# Patient Record
Sex: Female | Born: 1995 | Race: Black or African American | Hispanic: No | Marital: Married | Smoking: Never smoker
Health system: Southern US, Community
[De-identification: ages and names within clinical notes are randomized; demographics above are authoritative.]

## PROBLEM LIST (undated history)

## (undated) DIAGNOSIS — F419 Anxiety disorder, unspecified: Secondary | ICD-10-CM

## (undated) DIAGNOSIS — F32A Depression, unspecified: Secondary | ICD-10-CM

---
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
## 2016-11-05 ENCOUNTER — Other Ambulatory Visit: Payer: Self-pay | Admitting: Family

## 2016-11-05 DIAGNOSIS — N644 Mastodynia: Secondary | ICD-10-CM

## 2016-11-14 ENCOUNTER — Other Ambulatory Visit: Payer: Self-pay | Admitting: Family

## 2016-11-14 ENCOUNTER — Ambulatory Visit
Admission: RE | Admit: 2016-11-14 | Discharge: 2016-11-14 | Disposition: A | Discharge status: Home / Self Care | Payer: BLUE CROSS/BLUE SHIELD | Source: Ambulatory Visit | Attending: Family | Admitting: Family

## 2016-11-14 DIAGNOSIS — N644 Mastodynia: Secondary | ICD-10-CM

## 2017-09-21 DIAGNOSIS — Z118 Encounter for screening for other infectious and parasitic diseases: Secondary | ICD-10-CM | POA: Diagnosis not present

## 2017-11-02 DIAGNOSIS — Z3042 Encounter for surveillance of injectable contraceptive: Secondary | ICD-10-CM | POA: Diagnosis not present

## 2017-11-02 DIAGNOSIS — Z3202 Encounter for pregnancy test, result negative: Secondary | ICD-10-CM | POA: Diagnosis not present

## 2018-01-22 DIAGNOSIS — Z3042 Encounter for surveillance of injectable contraceptive: Secondary | ICD-10-CM | POA: Diagnosis not present

## 2018-05-28 DIAGNOSIS — N920 Excessive and frequent menstruation with regular cycle: Secondary | ICD-10-CM | POA: Diagnosis not present

## 2018-07-08 DIAGNOSIS — Z309 Encounter for contraceptive management, unspecified: Secondary | ICD-10-CM | POA: Diagnosis not present

## 2018-07-08 DIAGNOSIS — S39012A Strain of muscle, fascia and tendon of lower back, initial encounter: Secondary | ICD-10-CM | POA: Diagnosis not present

## 2018-07-08 DIAGNOSIS — M79641 Pain in right hand: Secondary | ICD-10-CM | POA: Diagnosis not present

## 2018-07-08 DIAGNOSIS — F419 Anxiety disorder, unspecified: Secondary | ICD-10-CM | POA: Diagnosis not present

## 2019-02-17 DIAGNOSIS — F329 Major depressive disorder, single episode, unspecified: Secondary | ICD-10-CM | POA: Insufficient documentation

## 2022-06-19 ENCOUNTER — Encounter: Payer: Self-pay | Admitting: Nurse Practitioner

## 2022-06-19 NOTE — Progress Notes (Signed)
Not seen

## 2022-07-14 NOTE — L&D Delivery Note (Signed)
Operative Delivery Note At 1:12 PM a viable and healthy female was delivered via Vaginal, Vacuum Investment banker, operational).  Presentation: vertex; Position: OA; Station: +2.  Verbal consent: obtained from patient.  Risks and benefits discussed in detail.  Risks include, but are not limited to the risks of anesthesia, bleeding, infection, damage to maternal tissues, fetal cephalhematoma.  There is also the risk of inability to effect vaginal delivery of the head, or shoulder dystocia that cannot be resolved by established maneuvers, leading to the need for emergency cesarean section. Indication: terminal bradycardia APGAR: 8, 9; weight pending .   Placenta status: spontaneous intact not sent , .   Cord: none with the following complications: .  Cord pH: n/a  Anesthesia:  epidural  Instruments: mushroom vacuum Episiotomy: None Lacerations: None Suture Repair:  n/a Est. Blood Loss (mL): 102  Mom to postpartum.  Baby to Couplet care / Skin to Skin.  Inita Uram A Philander Ake 03/19/2023, 2:27 PM

## 2022-09-04 LAB — OB RESULTS CONSOLE HEPATITIS B SURFACE ANTIGEN: Hepatitis B Surface Ag: NEGATIVE

## 2022-09-04 LAB — OB RESULTS CONSOLE HIV ANTIBODY (ROUTINE TESTING): HIV: NONREACTIVE

## 2022-09-04 LAB — OB RESULTS CONSOLE RUBELLA ANTIBODY, IGM: Rubella: IMMUNE

## 2022-09-04 LAB — OB RESULTS CONSOLE VARICELLA ZOSTER ANTIBODY, IGG: Varicella: UNDETERMINED

## 2022-09-04 LAB — OB RESULTS CONSOLE RPR: RPR: NONREACTIVE

## 2022-09-11 LAB — HEPATITIS C ANTIBODY: HCV Ab: NEGATIVE

## 2023-01-16 ENCOUNTER — Encounter: Payer: Self-pay | Admitting: Family Medicine

## 2023-01-16 ENCOUNTER — Ambulatory Visit (INDEPENDENT_AMBULATORY_CARE_PROVIDER_SITE_OTHER): Payer: Commercial Managed Care - HMO | Admitting: Family Medicine

## 2023-01-16 VITALS — BP 112/68 | HR 95 | Temp 98.5°F | Ht 65.0 in | Wt 190.2 lb

## 2023-01-16 DIAGNOSIS — Z7689 Persons encountering health services in other specified circumstances: Secondary | ICD-10-CM

## 2023-01-16 DIAGNOSIS — F411 Generalized anxiety disorder: Secondary | ICD-10-CM

## 2023-01-16 DIAGNOSIS — Z91018 Allergy to other foods: Secondary | ICD-10-CM | POA: Diagnosis not present

## 2023-01-16 DIAGNOSIS — Z8659 Personal history of other mental and behavioral disorders: Secondary | ICD-10-CM

## 2023-01-16 DIAGNOSIS — Z3A30 30 weeks gestation of pregnancy: Secondary | ICD-10-CM | POA: Diagnosis not present

## 2023-01-16 MED ORDER — EPINEPHRINE 0.3 MG/0.3ML IJ SOAJ: 0.3000 mg | INTRAMUSCULAR | 1 refills | Status: AC | PRN

## 2023-01-16 NOTE — Progress Notes (Signed)
Established Patient Office Visit   Subjective  Patient ID: Natasha Ross, female    DOB: Jun 23, 1996  Age: 27 y.o. MRN: [id number]  Chief Complaint  Patient presents with   Establish Care    Patient is a 27 year old female who presents to establish care and follow-up on ongoing concerns.  Patient states she has not had a PCP in over 4 years.  Previously seen at [facility]    Pregnancy: Patient currently pregnant.  She is a G1P0 at 30 and 2 followed by Dr. Cherly Hensen.  Patient states she has gained 6 pounds during her whole pregnancy.  Taking PNVs.  Denies any current issues.  Endorses history of recurrent UTIs.  Recently treated for UTI as she does not really like drinking water due to the metallic taste.  History of anxiety and depression: Previously on Zoloft 50 mg daily.  Not currently on medication due to pregnancy.  Allergies: NKDA Food allergy strawberries-hives and tongue swelling.  Had Rx for EpiPen.  Social history: Patient is married.  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  Patient will be starting school soon to get her PhD in psychology.  Patient denies alcohol, tobacco, drug use.  Family medical history: Mom-depression, diabetes, MI, HLD, HTN, mental illness, CVA, miscarriage Dad-EtOH abuse, asthma, depression, diabetes, HTN, mental illness MGM-asthma, breast cancer, depression, diabetes, MI, HLD MGF-deceased, arthritis, asthma, prostate cancer, COPD, depression, diabetes, hearing loss, MI, age [place], heart disease, HLD, kidney disease, mental illness, stroke PGM-deceased, breast cancer    History reviewed. No pertinent past medical history. History reviewed. No pertinent surgical history. Social History   Tobacco Use   Smoking status: Never  Substance Use Topics   Alcohol use: No   Family History  Problem Relation Age of Onset   Breast cancer Maternal Grandmother    Allergies  Allergen Reactions   Strawberry Extract      Strawberries (the fruit)-Hives and tongue edema      ROS Negative unless stated above    Objective:     BP 112/68 (BP Location: Left Arm, Patient Position: Sitting, Cuff Size: Normal)   Pulse 95   Temp 98.5 F (36.9 C) (Oral)   Ht 5\' 5"  (1.651 m)   Wt 190 lb 3.2 oz (86.3 kg)   SpO2 99%   BMI 31.65 kg/m    Physical Exam Constitutional:      General: She is not in acute distress.    Appearance: Normal appearance.  HENT:     Head: Normocephalic and atraumatic.     Nose: Nose normal.     Mouth/Throat:     Mouth: Mucous membranes are moist.  Cardiovascular:     Rate and Rhythm: Normal rate and regular rhythm.     Heart sounds: Normal heart sounds. No murmur heard.    No gallop.  Pulmonary:     Effort: Pulmonary effort is normal. No respiratory distress.     Breath sounds: Normal breath sounds. No wheezing, rhonchi or rales.  Abdominal:     Palpations: Abdomen is soft.     Tenderness: There is no abdominal tenderness.     Comments: Gravid.  Skin:    General: Skin is warm and dry.  Neurological:     Mental Status: She is alert and oriented to person, place, and time.        01/16/2023    1:07 PM  GAD 7 : Generalized Anxiety Score  Nervous, Anxious, on Edge 3  Control/stop  worrying 3  Worry too much - different things 3  Trouble relaxing 3  Restless 3  Easily annoyed or irritable 3  Afraid - awful might happen 3  Total GAD 7 Score 21  Anxiety Difficulty Very difficult       01/16/2023    1:07 PM  Depression screen PHQ 2/9  Decreased Interest 1  Down, Depressed, Hopeless 2  PHQ - 2 Score 3  Altered sleeping 3  Tired, decreased energy 3  Change in appetite 3  Feeling bad or failure about yourself  0  Trouble concentrating 2  Moving slowly or fidgety/restless 0  Suicidal thoughts 0  PHQ-9 Score 14  Difficult doing work/chores Very difficult    No results found for any visits on 01/16/23.    Assessment & Plan:  [redacted] weeks gestation of  pregnancy -Patient encouraged to continue PNV's and increase p.o. intake of water -Continue follow-up with Dr. Cherly Hensen, OB/GYN  Food allergy -     EPINEPHrine; Inject 0.3 mg into the muscle as needed for anaphylaxis.  Dispense: 2 each; Refill: 1  GAD (generalized anxiety disorder) -GAD-7 score 21 -Consider counseling -Not currently on Zoloft 50 mg daily due to pregnancy. -Continue to monitor  History of depression -PHQ-9 score 14 -Continue monitor closely  Encounter to establish care -We reviewed the PMH, PSH, FH, SH, Meds and Allergies. -We provided refills for any medications we will prescribe as needed. -We addressed current concerns per orders and patient instructions. -We have asked for records for pertinent exams, studies, vaccines and notes from previous providers. -We have advised patient to follow up per instructions below.   Return if symptoms worsen or fail to improve.   [name], MD

## 2023-01-16 NOTE — Patient Instructions (Signed)
A prescription for an EpiPen sent to her pharmacy just in case she need a new one.

## 2023-02-24 LAB — OB RESULTS CONSOLE GBS: GBS: POSITIVE

## 2023-03-18 ENCOUNTER — Other Ambulatory Visit: Payer: Self-pay

## 2023-03-18 ENCOUNTER — Encounter (HOSPITAL_COMMUNITY): Payer: Self-pay

## 2023-03-18 ENCOUNTER — Inpatient Hospital Stay (HOSPITAL_COMMUNITY)
Admission: AD | Admit: 2023-03-18 | Discharge: 2023-03-21 | DRG: 807 | Disposition: A | Discharge status: Home / Self Care | Payer: Medicaid Other | Attending: Obstetrics and Gynecology | Admitting: Obstetrics and Gynecology

## 2023-03-18 DIAGNOSIS — Z823 Family history of stroke: Secondary | ICD-10-CM | POA: Diagnosis not present

## 2023-03-18 DIAGNOSIS — Z3A39 39 weeks gestation of pregnancy: Secondary | ICD-10-CM | POA: Diagnosis not present

## 2023-03-18 DIAGNOSIS — O134 Gestational [pregnancy-induced] hypertension without significant proteinuria, complicating childbirth: Secondary | ICD-10-CM | POA: Diagnosis present

## 2023-03-18 DIAGNOSIS — O99824 Streptococcus B carrier state complicating childbirth: Secondary | ICD-10-CM | POA: Diagnosis present

## 2023-03-18 DIAGNOSIS — Z833 Family history of diabetes mellitus: Secondary | ICD-10-CM | POA: Diagnosis not present

## 2023-03-18 DIAGNOSIS — Z8249 Family history of ischemic heart disease and other diseases of the circulatory system: Secondary | ICD-10-CM | POA: Diagnosis not present

## 2023-03-18 DIAGNOSIS — O99214 Obesity complicating childbirth: Secondary | ICD-10-CM | POA: Diagnosis present

## 2023-03-18 DIAGNOSIS — O133 Gestational [pregnancy-induced] hypertension without significant proteinuria, third trimester: Principal | ICD-10-CM | POA: Diagnosis present

## 2023-03-18 DIAGNOSIS — O1404 Mild to moderate pre-eclampsia, complicating childbirth: Principal | ICD-10-CM | POA: Diagnosis present

## 2023-03-18 HISTORY — DX: Anxiety disorder, unspecified: F41.9

## 2023-03-18 HISTORY — DX: Depression, unspecified: F32.A

## 2023-03-18 LAB — TYPE AND SCREEN
ABO/RH(D): O POS
Antibody Screen: NEGATIVE

## 2023-03-18 LAB — PROTEIN / CREATININE RATIO, URINE
Creatinine, Urine: 136 mg/dL
Protein Creatinine Ratio: 0.15 mg/mg{creat} (ref 0.00–0.15)
Total Protein, Urine: 20 mg/dL

## 2023-03-18 LAB — COMPREHENSIVE METABOLIC PANEL
ALT: 19 U/L (ref 0–44)
AST: 26 U/L (ref 15–41)
Albumin: 2.8 g/dL — ABNORMAL LOW (ref 3.5–5.0)
Alkaline Phosphatase: 160 U/L — ABNORMAL HIGH (ref 38–126)
Anion gap: 11 (ref 5–15)
BUN: 11 mg/dL (ref 6–20)
CO2: 18 mmol/L — ABNORMAL LOW (ref 22–32)
Calcium: 9 mg/dL (ref 8.9–10.3)
Chloride: 104 mmol/L (ref 98–111)
Creatinine, Ser: 0.8 mg/dL (ref 0.44–1.00)
GFR, Estimated: >60 mL/min (ref >60)
Glucose, Bld: 64 mg/dL — ABNORMAL LOW (ref 70–99)
Potassium: 4 mmol/L (ref 3.5–5.1)
Sodium: 133 mmol/L — ABNORMAL LOW (ref 135–145)
Total Bilirubin: 0.5 mg/dL (ref 0.3–1.2)
Total Protein: 6.8 g/dL (ref 6.5–8.1)

## 2023-03-18 LAB — CBC
HCT: 32 % — ABNORMAL LOW (ref 36.0–46.0)
Hemoglobin: 10.9 g/dL — ABNORMAL LOW (ref 12.0–15.0)
MCH: 31.5 pg (ref 26.0–34.0)
MCHC: 34.1 g/dL (ref 30.0–36.0)
MCV: 92.5 fL (ref 80.0–100.0)
Platelets: 164 10*3/uL (ref 150–400)
RBC: 3.46 MIL/uL — ABNORMAL LOW (ref 3.87–5.11)
RDW: 13.3 % (ref 11.5–15.5)
WBC: 8 10*3/uL (ref 4.0–10.5)
nRBC: 0 % (ref 0.0–0.2)

## 2023-03-18 MED ORDER — TERBUTALINE SULFATE 1 MG/ML IJ SOLN: 0.2500 mg | Freq: Once | INTRAMUSCULAR | Status: DC | PRN

## 2023-03-18 MED ORDER — OXYTOCIN BOLUS FROM INFUSION
333.0000 mL | Freq: Once | INTRAVENOUS | Status: AC
  Administered 2023-03-19: 333 mL via INTRAVENOUS

## 2023-03-18 MED ORDER — LACTATED RINGERS IV SOLN: INTRAVENOUS | Status: DC

## 2023-03-18 MED ORDER — OXYTOCIN-SODIUM CHLORIDE 30-0.9 UT/500ML-% IV SOLN: 2.5000 [IU]/h | INTRAVENOUS | Status: DC

## 2023-03-18 MED ORDER — PENICILLIN G POT IN DEXTROSE 60000 UNIT/ML IV SOLN
3.0000 10*6.[IU] | INTRAVENOUS | Status: DC
  Administered 2023-03-18 – 2023-03-19 (×4): 3 10*6.[IU] via INTRAVENOUS
  Filled 2023-03-18 (×4): qty 50

## 2023-03-18 MED ORDER — LACTATED RINGERS IV SOLN
500.0000 mL | INTRAVENOUS | Status: DC | PRN
  Administered 2023-03-19: 500 mL via INTRAVENOUS

## 2023-03-18 MED ORDER — ACETAMINOPHEN 325 MG PO TABS: 650.0000 mg | ORAL_TABLET | ORAL | Status: DC | PRN

## 2023-03-18 MED ORDER — LIDOCAINE HCL (PF) 1 % IJ SOLN: 30.0000 mL | INTRAMUSCULAR | Status: DC | PRN

## 2023-03-18 MED ORDER — SODIUM CHLORIDE 0.9 % IV SOLN
5.0000 10*6.[IU] | Freq: Once | INTRAVENOUS | Status: AC
  Administered 2023-03-18: 5 10*6.[IU] via INTRAVENOUS
  Filled 2023-03-18: qty 5

## 2023-03-18 MED ORDER — SOD CITRATE-CITRIC ACID 500-334 MG/5ML PO SOLN: 30.0000 mL | ORAL | Status: DC | PRN

## 2023-03-18 MED ORDER — OXYCODONE-ACETAMINOPHEN 5-325 MG PO TABS: 1.0000 | ORAL_TABLET | ORAL | Status: DC | PRN

## 2023-03-18 MED ORDER — OXYCODONE-ACETAMINOPHEN 5-325 MG PO TABS
2.0000 | ORAL_TABLET | ORAL | Status: DC | PRN
  Administered 2023-03-19: 2 via ORAL
  Filled 2023-03-18: qty 2

## 2023-03-18 MED ORDER — MISOPROSTOL 50MCG HALF TABLET
50.0000 ug | ORAL_TABLET | Freq: Once | ORAL | Status: AC
  Administered 2023-03-18: 50 ug via ORAL
  Filled 2023-03-18: qty 1

## 2023-03-18 MED ORDER — MISOPROSTOL 50MCG HALF TABLET
50.0000 ug | ORAL_TABLET | ORAL | Status: DC | PRN
  Administered 2023-03-18: 50 ug via ORAL
  Filled 2023-03-18: qty 1

## 2023-03-18 MED ORDER — MISOPROSTOL 25 MCG QUARTER TABLET
25.0000 ug | ORAL_TABLET | Freq: Once | ORAL | Status: AC
  Administered 2023-03-18: 25 ug via VAGINAL
  Filled 2023-03-18: qty 1

## 2023-03-18 MED ORDER — OXYTOCIN 10 UNIT/ML IJ SOLN: 10.0000 [IU] | Freq: Once | INTRAMUSCULAR | Status: DC

## 2023-03-18 MED ORDER — ONDANSETRON HCL 4 MG/2ML IJ SOLN: 4.0000 mg | Freq: Four times a day (QID) | INTRAMUSCULAR | Status: DC | PRN

## 2023-03-18 NOTE — H&P (Incomplete)
Natasha Ross is an 27 y.o. female. ***  Pertinent Gynecological History: Menses: {menses:16152} Bleeding: {uterine bleeding:32112} Contraception: {contraception:5051} DES exposure: {denies/unknown:32108} Blood transfusions: {none:33079} Sexually transmitted diseases: {std risk:32110} Previous GYN Procedures: {previous procedures:3041388}  Last mammogram: {normal/abnormal***:32111} Date: *** Last pap: {normal/abnormal***:32111} Date: *** OB History: G***, P***   Menstrual History: Menarche age: *** No LMP recorded. Patient is pregnant.    No past medical history on file.  No past surgical history on file.  Family History  Problem Relation Age of Onset   Hypertension Mother    Stroke Mother    Diabetes Mother    Depression Mother    Heart attack Mother    High Cholesterol Mother    Mental illness Mother    Miscarriages / [place] Mother    Mental illness Father    Hypertension Father    Depression Father    Diabetes Father    Asthma Father    Alcohol abuse Father    Depression Maternal Grandmother    High Cholesterol Maternal Grandmother    Cancer Maternal Grandmother        breast cancer   Asthma Maternal Grandmother    Breast cancer Maternal Grandmother    Heart disease Maternal Grandmother    Stroke Maternal Grandfather    Mental illness Maternal Grandfather    Hypertension Maternal Grandfather    High Cholesterol Maternal Grandfather    Heart attack Maternal Grandfather    Diabetes Maternal Grandfather    Cancer Maternal Grandfather        prostate cancer   Asthma Maternal Grandfather    Arthritis Maternal Grandfather    COPD Maternal Grandfather    Kidney disease Maternal Grandfather    Cancer Paternal Grandmother        breast cancer    Social History:  reports that she has never smoked. She does not have any smokeless tobacco history on file. She reports that she does not drink alcohol. No history on file for drug use.  Allergies:  Allergies   Allergen Reactions   Strawberry Extract     Strawberries (the fruit)-Hives and tongue edema    Medications Prior to Admission  Medication Sig Dispense Refill Last Dose   amoxicillin-clavulanate (AUGMENTIN) 875-125 MG per tablet Take 1 tablet by mouth 2 (two) times daily. (Patient not taking: Reported on 01/16/2023) 20 tablet 0    EPINEPHrine 0.3 mg/0.3 mL IJ SOAJ injection Inject 0.3 mg into the muscle as needed for anaphylaxis. 2 each 1    Norethin Ace-Eth Estrad-FE (MINASTRIN 24 FE PO) Take by mouth. (Patient not taking: Reported on 01/16/2023)      triamcinolone cream (KENALOG) 0.1 % Apply 1 application topically 2 (two) times daily. (Patient not taking: Reported on 01/16/2023) 30 g 0     Review of Systems  All other systems reviewed and are negative.   There were no vitals taken for this visit. Physical Exam  No results found for this or any previous visit (from the past 24 hour(s)).  No results found.  Assessment/Plan: ***  Natasha Ross A Natasha Ross 03/18/2023, 5:56 PM

## 2023-03-19 ENCOUNTER — Inpatient Hospital Stay (HOSPITAL_COMMUNITY): Payer: Medicaid Other | Admitting: Anesthesiology

## 2023-03-19 ENCOUNTER — Encounter (HOSPITAL_COMMUNITY): Payer: Self-pay | Admitting: Obstetrics and Gynecology

## 2023-03-19 LAB — CBC
HCT: 32.1 % — ABNORMAL LOW (ref 36.0–46.0)
Hemoglobin: 10.9 g/dL — ABNORMAL LOW (ref 12.0–15.0)
MCH: 31.1 pg (ref 26.0–34.0)
MCHC: 34 g/dL (ref 30.0–36.0)
MCV: 91.5 fL (ref 80.0–100.0)
Platelets: 155 10*3/uL (ref 150–400)
RBC: 3.51 MIL/uL — ABNORMAL LOW (ref 3.87–5.11)
RDW: 13.2 % (ref 11.5–15.5)
WBC: 8.5 10*3/uL (ref 4.0–10.5)
nRBC: 0 % (ref 0.0–0.2)

## 2023-03-19 LAB — RPR: RPR Ser Ql: NONREACTIVE

## 2023-03-19 MED ORDER — WITCH HAZEL-GLYCERIN EX PADS: 1.0000 | MEDICATED_PAD | CUTANEOUS | Status: DC | PRN

## 2023-03-19 MED ORDER — PHENYLEPHRINE 80 MCG/ML (10ML) SYRINGE FOR IV PUSH (FOR BLOOD PRESSURE SUPPORT): 80.0000 ug | PREFILLED_SYRINGE | INTRAVENOUS | Status: DC | PRN

## 2023-03-19 MED ORDER — FERROUS SULFATE 325 (65 FE) MG PO TABS
325.0000 mg | ORAL_TABLET | Freq: Two times a day (BID) | ORAL | Status: DC
  Administered 2023-03-19 – 2023-03-21 (×5): 325 mg via ORAL
  Filled 2023-03-19 (×5): qty 1

## 2023-03-19 MED ORDER — ONDANSETRON HCL 4 MG PO TABS: 4.0000 mg | ORAL_TABLET | ORAL | Status: DC | PRN

## 2023-03-19 MED ORDER — ACETAMINOPHEN 325 MG PO TABS
650.0000 mg | ORAL_TABLET | ORAL | Status: DC | PRN
  Administered 2023-03-20 – 2023-03-21 (×2): 650 mg via ORAL
  Filled 2023-03-19 (×2): qty 2

## 2023-03-19 MED ORDER — IBUPROFEN 600 MG PO TABS
600.0000 mg | ORAL_TABLET | Freq: Four times a day (QID) | ORAL | Status: DC
  Administered 2023-03-19 – 2023-03-21 (×6): 600 mg via ORAL
  Filled 2023-03-19 (×7): qty 1

## 2023-03-19 MED ORDER — DIPHENHYDRAMINE HCL 25 MG PO CAPS: 25.0000 mg | ORAL_CAPSULE | Freq: Four times a day (QID) | ORAL | Status: DC | PRN

## 2023-03-19 MED ORDER — LIDOCAINE HCL (PF) 1 % IJ SOLN
INTRAMUSCULAR | Status: DC | PRN
  Administered 2023-03-19: 2 mL via EPIDURAL
  Administered 2023-03-19: 10 mL via EPIDURAL

## 2023-03-19 MED ORDER — ZOLPIDEM TARTRATE 5 MG PO TABS: 5.0000 mg | ORAL_TABLET | Freq: Every evening | ORAL | Status: DC | PRN

## 2023-03-19 MED ORDER — FENTANYL-BUPIVACAINE-NACL 0.5-0.125-0.9 MG/250ML-% EP SOLN
12.0000 mL/h | EPIDURAL | Status: DC | PRN
  Filled 2023-03-19: qty 250

## 2023-03-19 MED ORDER — COCONUT OIL OIL
1.0000 | TOPICAL_OIL | Status: DC | PRN
  Administered 2023-03-21: 1 via TOPICAL

## 2023-03-19 MED ORDER — TERBUTALINE SULFATE 1 MG/ML IJ SOLN
0.2500 mg | Freq: Once | INTRAMUSCULAR | Status: AC | PRN
  Administered 2023-03-19: 0.25 mg via SUBCUTANEOUS
  Filled 2023-03-19: qty 1

## 2023-03-19 MED ORDER — SIMETHICONE 80 MG PO CHEW: 80.0000 mg | CHEWABLE_TABLET | ORAL | Status: DC | PRN

## 2023-03-19 MED ORDER — ONDANSETRON HCL 4 MG/2ML IJ SOLN: 4.0000 mg | INTRAMUSCULAR | Status: DC | PRN

## 2023-03-19 MED ORDER — FENTANYL-BUPIVACAINE-NACL 0.5-0.125-0.9 MG/250ML-% EP SOLN
EPIDURAL | Status: DC | PRN
  Administered 2023-03-19: 12 mL/h via EPIDURAL

## 2023-03-19 MED ORDER — SENNOSIDES-DOCUSATE SODIUM 8.6-50 MG PO TABS
2.0000 | ORAL_TABLET | Freq: Every day | ORAL | Status: DC
  Administered 2023-03-20 – 2023-03-21 (×2): 2 via ORAL
  Filled 2023-03-19 (×2): qty 2

## 2023-03-19 MED ORDER — FUROSEMIDE 20 MG PO TABS
40.0000 mg | ORAL_TABLET | Freq: Every day | ORAL | Status: DC
  Administered 2023-03-19 – 2023-03-21 (×3): 40 mg via ORAL
  Filled 2023-03-19 (×3): qty 2

## 2023-03-19 MED ORDER — LACTATED RINGERS IV SOLN: 500.0000 mL | Freq: Once | INTRAVENOUS | Status: DC

## 2023-03-19 MED ORDER — PRENATAL MULTIVITAMIN CH
1.0000 | ORAL_TABLET | Freq: Every day | ORAL | Status: DC
  Administered 2023-03-20 – 2023-03-21 (×2): 1 via ORAL
  Filled 2023-03-19 (×2): qty 1

## 2023-03-19 MED ORDER — BENZOCAINE-MENTHOL 20-0.5 % EX AERO: 1.0000 | INHALATION_SPRAY | CUTANEOUS | Status: DC | PRN

## 2023-03-19 MED ORDER — DIBUCAINE (PERIANAL) 1 % EX OINT: 1.0000 | TOPICAL_OINTMENT | CUTANEOUS | Status: DC | PRN

## 2023-03-19 MED ORDER — DIPHENHYDRAMINE HCL 50 MG/ML IJ SOLN: 12.5000 mg | INTRAMUSCULAR | Status: DC | PRN

## 2023-03-19 MED ORDER — LACTATED RINGERS AMNIOINFUSION: INTRAVENOUS | Status: DC

## 2023-03-19 MED ORDER — OXYTOCIN-SODIUM CHLORIDE 30-0.9 UT/500ML-% IV SOLN
1.0000 m[IU]/min | INTRAVENOUS | Status: DC
  Administered 2023-03-19: 2 m[IU]/min via INTRAVENOUS
  Filled 2023-03-19: qty 500

## 2023-03-19 MED ORDER — EPHEDRINE 5 MG/ML INJ: 10.0000 mg | INTRAVENOUS | Status: DC | PRN

## 2023-03-19 NOTE — Progress Notes (Signed)
Natasha Ross is Natasha 27 y.o. G1P0 at [redacted]w[redacted]d by LMP admitted for induction of labor due to Hypertension.  Subjective: C/o back pain S/p cytotec x 2  Objective: BP 134/88   Pulse 74   Temp 98.4 F (36.9 C) (Oral)   Resp 18   Ht 5\' 5"  (1.651 m)   Wt 97.5 kg   SpO2 98%   BMI 35.78 kg/m  No intake/output data recorded. No intake/output data recorded.  FHT:  FHR: 130 bpm, variability: moderate,  accelerations:  Present,  decelerations:  Absent UC:   irregular, every 3-4 minutes SVE:   1 cm dilated, 90 % effaced, -2/-1 station Tracing: cat 1  Labs: Lab Results  Component Value Date   WBC 8.0 03/18/2023   HGB 10.9 (L) 03/18/2023   HCT 32.0 (L) 03/18/2023   MCV 92.5 03/18/2023   PLT 164 03/18/2023      Latest Ref Rng & Units 03/18/2023    6:16 PM  CMP  Glucose 70 - 99 mg/dL 64   BUN 6 - 20 mg/dL 11   Creatinine 5.40 - 1.00 mg/dL 9.81   Sodium 191 - 478 mmol/L 133   Potassium 3.5 - 5.1 mmol/L 4.0   Chloride 98 - 111 mmol/L 104   CO2 22 - 32 mmol/L 18   Calcium 8.9 - 10.3 mg/dL 9.0   Total Protein 6.5 - 8.1 g/dL 6.8   Total Bilirubin 0.3 - 1.2 mg/dL 0.5   Alkaline Phos 38 - 126 U/L 160   AST 15 - 41 U/L 26   ALT 0 - 44 U/L 19    PCR 0.14 Assessment / Plan: Latent phase Gestational HTN Term gestation GBS cx positive P) ptiocin augmentation at 3 am. Continue IV PCN. Circuit for possible OP    Anticipated MOD:  NSVD  Natasha Ross Natasha Ross 03/19/2023, 2:03 AM

## 2023-03-19 NOTE — Progress Notes (Signed)
S: notes some rectal pressure  O:  Epidural  Pitocin 4 miu VS: BP 131/85 P61 VE 9 cm /100/+1 asynclitic  LOP  IUPC,ISE placed  Tracing reviewed. Baseline 130  min accels (+) variable deceleration Ctx q 2-4 mins  ( prolonged ctx) . Suspect tetanic contractions  IMP: active phase Variable decelerations due to cord compression Mild preeclampsia P) start aminoinfusion. D/c pitocin. Positional changes given OP

## 2023-03-19 NOTE — Anesthesia Procedure Notes (Signed)
Epidural Patient location during procedure: OB Start time: 03/19/2023 6:15 AM End time: 03/19/2023 6:25 AM  Staffing Anesthesiologist: Lannie Fields, DO Performed: anesthesiologist   Preanesthetic Checklist Completed: patient identified, IV checked, risks and benefits discussed, monitors and equipment checked, pre-op evaluation and timeout performed  Epidural Patient position: sitting Prep: DuraPrep and site prepped and draped Patient monitoring: continuous pulse ox, blood pressure, heart rate and cardiac monitor Approach: midline Location: L3-L4 Injection technique: LOR air  Needle:  Needle type: Tuohy  Needle gauge: 17 G Needle length: 9 cm Needle insertion depth: 7 cm Catheter type: closed end flexible Catheter size: 19 Gauge Catheter at skin depth: 12 cm Test dose: negative  Assessment Sensory level: T8 Events: blood not aspirated, no cerebrospinal fluid, injection not painful, no injection resistance, no paresthesia and negative IV test  Additional Notes Patient identified. Risks/Benefits/Options discussed with patient including but not limited to bleeding, infection, nerve damage, paralysis, failed block, incomplete pain control, headache, blood pressure changes, nausea, vomiting, reactions to medication both or allergic, itching and postpartum back pain. Confirmed with bedside nurse the patient's most recent platelet count. Confirmed with patient that they are not currently taking any anticoagulation, have any bleeding history or any family history of bleeding disorders. Patient expressed understanding and wished to proceed. All questions were answered. Sterile technique was used throughout the entire procedure. Please see nursing notes for vital signs. Test dose was given through epidural catheter and negative prior to continuing to dose epidural or start infusion. Warning signs of high block given to the patient including shortness of breath, tingling/numbness in  hands, complete motor block, or any concerning symptoms with instructions to call for help. Patient was given instructions on fall risk and not to get out of bed. All questions and concerns addressed with instructions to call with any issues or inadequate analgesia.  Reason for block:procedure for pain

## 2023-03-19 NOTE — H&P (Signed)
Natasha Ross is a 27 y.o. female presenting @ 39 wk for IOL due to gestational HTN noted at office visit today. Pt c/o SOB and increased leg swelling. Denies h/a, visual changes or epigastric pain. OB History     Gravida  2   Para      Term      Preterm      AB   1   Living         SAB   1   IAB      Ectopic      Multiple      Live Births             Past Medical History:  Diagnosis Date   Anxiety    Depression    History reviewed. No pertinent surgical history. Family History: family history includes Alcohol abuse in her father; Arthritis in her maternal grandfather; Asthma in her father, maternal grandfather, and maternal grandmother; Breast cancer in her maternal grandmother; COPD in her maternal grandfather; Cancer in her maternal grandfather, maternal grandmother, and paternal grandmother; Depression in her father, maternal grandmother, and mother; Diabetes in her father, maternal grandfather, and mother; Heart attack in her maternal grandfather and mother; Heart disease in her maternal grandmother; High Cholesterol in her maternal grandfather, maternal grandmother, and mother; Hypertension in her father, maternal grandfather, and mother; Kidney disease in her maternal grandfather; Mental illness in her father, maternal grandfather, and mother; Miscarriages / Stillbirths in her mother; Stroke in her maternal grandfather and mother. Social History:  reports that she has never smoked. She does not have any smokeless tobacco history on file. She reports that she does not currently use alcohol. She reports that she does not use drugs.     Maternal Diabetes: No Genetic Screening: Normal Maternal Ultrasounds/Referrals: Normal Fetal Ultrasounds or other Referrals:  None Maternal Substance Abuse:  No Significant Maternal Medications:  None Significant Maternal Lab Results:  Group B Strep positive Number of Prenatal Visits:greater than 3 verified prenatal visits Other  Comments:  None  Review of Systems  Eyes:  Negative for visual disturbance.  Respiratory:  Positive for shortness of breath.   Cardiovascular:  Positive for leg swelling.  Neurological:  Negative for headaches.   History Dilation: 1 Effacement (%): 70 Station: 0 Exam by:: A.Hairston,RN Blood pressure 134/88, pulse 74, temperature 98.4 F (36.9 C), temperature source Oral, resp. rate 18, height 5\' 5"  (1.651 m), weight 97.5 kg, SpO2 98%. Exam Physical Exam Constitutional:      Appearance: Normal appearance.  Eyes:     Extraocular Movements: Extraocular movements intact.  Cardiovascular:     Rate and Rhythm: Regular rhythm.     Heart sounds: Normal heart sounds.  Pulmonary:     Breath sounds: Normal breath sounds.  Musculoskeletal:        General: Swelling present.     Cervical back: Neck supple.  Skin:    General: Skin is warm and dry.  Neurological:     General: No focal deficit present.     Mental Status: She is alert and oriented to person, place, and time.  Psychiatric:        Mood and Affect: Mood normal.        Behavior: Behavior normal.     Prenatal labs: ABO, Rh: --/--/O POS (09/04 1814) Antibody: NEG (09/04 1814) Rubella: Immune (02/22 0000) RPR: Nonreactive (02/22 0000)  HBsAg: Negative (02/22 0000)  HIV: Non-reactive (02/22 0000)  GBS:   positive  Assessment/Plan: Gestational  HTN Term gestation GBS cx positive P) admit PIH labs. IV PCN. Oral and vaginal cytotec   Emiliya Chretien A Etoile Looman 03/19/2023, 1:32 AM

## 2023-03-19 NOTE — Lactation Note (Addendum)
This note was copied from a baby's chart. Lactation Consultation Note  Patient Name: Natasha Ross [id number] Date: 03/19/2023 Age:26 hours Reason for consult: Initial assessment;Primapara;1st time breastfeeding;Term;Infant < 6lbs  P1- Infant gaggy, but was cleared with patting and bulb syringe. Clear mucus was present.  LC fit MOB with a 24 mm flange. LC noted that MOB's right nipple and areola had dryness. LC asked nurse to provide coconut oil to MOB.  LC reviewed pumping protocol and LC services paper.  LC Plan: Breastfeeding goals for 24 hrs: -Feed with feeding cues -By 3 hrs offer breast first for 15-20 min -Then supplement with EBM or formula, following the supplementary guidelines -Post pump bilaterally for 15 min and save EBM for next feeding  Maternal Data Has patient been taught Hand Expression?: Yes Does the patient have breastfeeding experience prior to this delivery?: No  Feeding Mother's Current Feeding Choice: Breast Milk and Formula Nipple Type: Nfant Standard Flow (white)  LATCH Score Latch:  (baby recently fed 20 mL and did not show signs of hunger)  Lactation Tools Discussed/Used Tools: Pump;Flanges;Coconut oil Flange Size: 24 Breast pump type: Double-Electric Breast Pump;Manual Pump Education: Setup, frequency, and cleaning;Milk Storage Reason for Pumping: less than 6 lbs Pumping frequency: 15-20 min every 2-3 hrs or when infant feeds  Interventions Interventions: Breast feeding basics reviewed;Hand express;Expressed milk;Coconut oil;Hand pump;DEBP;Education;Pace feeding;Guidelines for Milk Supply and Pumping Schedule Handout;LC Services brochure  Discharge Pump: DEBP;Manual;Hands Free;Personal (MOB has hands free pump at home. Set her up with DEBP at hospital.)  Consult Status Consult Status: Follow-up Date: 03/20/23 Follow-up type: In-patient    Dema Severin IBCLC, BS 03/19/2023, 6:40 PM

## 2023-03-19 NOTE — Progress Notes (Signed)
S: notes rectal pressure Very sleepy  O: Epidural VS: BP 117/79 P77 T97.6 Pitocin 2 miu  VE fully +2 station with caput  Tracing: baseline 130. Deep variable decels Ctx q 2 mins.   Amnioinfusion ongoing  IMP:  complete Deep variables deceleration related to suspected cord compression P) start pushing. Disc vacuum assistance with pt and family due to fetal tracing. Agree with plan

## 2023-03-19 NOTE — Anesthesia Preprocedure Evaluation (Signed)
Anesthesia Evaluation  Patient identified by MRN, date of birth, ID band Patient awake    Reviewed: Allergy & Precautions, Patient's Chart, lab work & pertinent test results  Airway Mallampati: II  TM Distance: >3 FB Neck ROM: Full    Dental no notable dental hx.    Pulmonary neg pulmonary ROS   Pulmonary exam normal breath sounds clear to auscultation       Cardiovascular hypertension (gHTN), Normal cardiovascular exam Rhythm:Regular Rate:Normal     Neuro/Psych  PSYCHIATRIC DISORDERS Anxiety Depression    negative neurological ROS     GI/Hepatic negative GI ROS, Neg liver ROS,,,  Endo/Other  Obesity BMI 36  Renal/GU negative Renal ROS  negative genitourinary   Musculoskeletal negative musculoskeletal ROS (+)    Abdominal  (+) + obese  Peds negative pediatric ROS (+)  Hematology  (+) Blood dyscrasia, anemia Hb 10.9, plt 155   Anesthesia Other Findings   Reproductive/Obstetrics (+) Pregnancy                             Anesthesia Physical Anesthesia Plan  ASA: 3  Anesthesia Plan: Epidural   Post-op Pain Management:    Induction:   PONV Risk Score and Plan: 2  Airway Management Planned: Natural Airway  Additional Equipment: None  Intra-op Plan:   Post-operative Plan:   Informed Consent: I have reviewed the patients History and Physical, chart, labs and discussed the procedure including the risks, benefits and alternatives for the proposed anesthesia with the patient or authorized representative who has indicated his/her understanding and acceptance.       Plan Discussed with:   Anesthesia Plan Comments:        Anesthesia Quick Evaluation

## 2023-03-20 LAB — CBC
HCT: 26.2 % — ABNORMAL LOW (ref 36.0–46.0)
Hemoglobin: 8.9 g/dL — ABNORMAL LOW (ref 12.0–15.0)
MCH: 31.9 pg (ref 26.0–34.0)
MCHC: 34 g/dL (ref 30.0–36.0)
MCV: 93.9 fL (ref 80.0–100.0)
Platelets: 133 10*3/uL — ABNORMAL LOW (ref 150–400)
RBC: 2.79 MIL/uL — ABNORMAL LOW (ref 3.87–5.11)
RDW: 13.4 % (ref 11.5–15.5)
WBC: 13.8 10*3/uL — ABNORMAL HIGH (ref 4.0–10.5)
nRBC: 0 % (ref 0.0–0.2)

## 2023-03-20 NOTE — Anesthesia Postprocedure Evaluation (Signed)
Anesthesia Post Note  Patient: Natasha Ross  Procedure(s) Performed: AN AD HOC LABOR EPIDURAL     Patient location during evaluation: Mother Baby Anesthesia Type: Epidural Level of consciousness: awake and alert Pain management: pain level controlled Vital Signs Assessment: post-procedure vital signs reviewed and stable Respiratory status: spontaneous breathing, nonlabored ventilation and respiratory function stable Cardiovascular status: stable Postop Assessment: no headache, no backache and epidural receding Anesthetic complications: no   No notable events documented.  Last Vitals:  Vitals:   03/20/23 0042 03/20/23 0437  BP: 126/84 133/80  Pulse: 84 60  Resp: 18 18  Temp: 36.9 C   SpO2: 100% 100%    Last Pain:  Vitals:   03/20/23 0726  TempSrc:   PainSc: 0-No pain   Pain Goal:                   Zafir Schauer

## 2023-03-20 NOTE — Social Work (Signed)
MOB was referred for history of depression/anxiety.  * Referral screened out by Clinical Social Worker because none of the following criteria appear to apply:  ~ History of anxiety/depression during this pregnancy, or of post-partum depression following prior delivery.  ~ Diagnosis of anxiety and/or depression within last 3 years OR * MOB's symptoms currently being treated with medication and/or therapy.  Per chart review MOB was diagnosed prior to September 2021. No concerns noted during pregnancy.   Please contact the Clinical Social Worker if needs arise, or by MOB request.   Wende Neighbors, LCSWA Clinical Social Worker [phone]

## 2023-03-20 NOTE — Lactation Note (Signed)
This note was copied from a baby's chart. Lactation Consultation Note  Patient Name: Girl Annalayah Trumble [id number] Date: 03/20/2023 Age:26 hours Reason for consult: Follow-up assessment;Primapara;1st time breastfeeding;Term;Infant < 6lbs  P1- MOB states that feedings are still doing well. MOB states she has been placing infant to the breast first, then following with formula supplementation due to infant weight.   MOB denies pain while infant is nursing, but states that nipples are sore when resting. No tissue breakdown noted. LC provided MOB with coconut oil to help her soreness.  LC reviewed the [facility] resources handout and encouraged MOB to call for further assistance if needed.  Maternal Data Has patient been taught Hand Expression?: Yes Does the patient have breastfeeding experience prior to this delivery?: No  Feeding Mother's Current Feeding Choice: Breast Milk and Formula  Lactation Tools Discussed/Used Tools: Coconut oil  Interventions Interventions: Breast feeding basics reviewed;Coconut oil;Education  Consult Status Consult Status: Follow-up Date: 03/21/23 Follow-up type: In-patient    Dema Severin BS, IBCLC 03/20/2023, 5:03 PM

## 2023-03-20 NOTE — Progress Notes (Signed)
PPD1 SVD:   S:  Pt reports feeling well/ Tolerating po/ Voiding without problems/ No n/v/ Bleeding is light/ Pain controlled withprescription NSAID's including ibuprofen (Motrin)  Newborn info live female BRF   O:  A & O x 3 / VS: Blood pressure 133/80, pulse 60, temperature 98.4 F (36.9 C), temperature source Oral, resp. rate 18, height 5\' 5"  (1.651 m), weight 97.5 kg, SpO2 100%, unknown if currently breastfeeding.  LABS:  Results for orders placed or performed during the hospital encounter of 03/18/23 (from the past 24 hour(s))  CBC     Status: Abnormal   Collection Time: 03/20/23  5:42 AM  Result Value Ref Range   WBC 13.8 (H) 4.0 - 10.5 K/uL   RBC 2.79 (L) 3.87 - 5.11 MIL/uL   Hemoglobin 8.9 (L) 12.0 - 15.0 g/dL   HCT 44.0 (L) 34.7 - 42.5 %   MCV 93.9 80.0 - 100.0 fL   MCH 31.9 26.0 - 34.0 pg   MCHC 34.0 30.0 - 36.0 g/dL   RDW 95.6 38.7 - 56.4 %   Platelets 133 (L) 150 - 400 K/uL   nRBC 0.0 0.0 - 0.2 %    I&O: I/O last 3 completed shifts: In: -  Out: 752 [Urine:650; Blood:102]   No intake/output data recorded.  Lungs: chest clear, no wheezing, rales, normal symmetric air entry  Heart: regular rate and rhythm, S1, S2 normal, no murmur, click, rub or gallop  Abdomen: uterus firm at umb  Perineum: not inspected  Lochia: light  Extremities:no redness or tenderness in the calves or thighs, edema 1-2+    A/P: PPD # 1/ G1P1001 Gestational HTN on lasix  Doing well  Continue routine post partum orders

## 2023-03-21 MED ORDER — POLYSACCHARIDE IRON COMPLEX 150 MG PO CAPS: 150.0000 mg | ORAL_CAPSULE | Freq: Every day | ORAL | 6 refills | Status: DC

## 2023-03-21 MED ORDER — FUROSEMIDE 40 MG PO TABS: 40.0000 mg | ORAL_TABLET | Freq: Every day | ORAL | 0 refills | Status: DC

## 2023-03-21 MED ORDER — IBUPROFEN 600 MG PO TABS: 600.0000 mg | ORAL_TABLET | Freq: Four times a day (QID) | ORAL | 11 refills | Status: AC | PRN

## 2023-03-21 NOTE — Discharge Instructions (Signed)
Call if temperature greater than equal to 100.4, nothing per vagina for 4-6 weeks or severe nausea vomiting, increased incisional pain , drainage or redness in the incision site, no straining with bowel movements, showers no bath °

## 2023-03-21 NOTE — Discharge Summary (Signed)
Postpartum Discharge Summary  Date of Service updated***     Patient Name: Natasha Ross DOB: [birth year] MRN: [id number]  Date of admission: 03/18/2023 Delivery date:03/19/2023 Delivering provider: Edna Rede Date of discharge: 03/21/2023  Admitting diagnosis: Gestational htn w/o significant proteinuria, third trimester [O13.3] Vacuum-assisted vaginal delivery [Z37.9] Intrauterine pregnancy: [redacted]w[redacted]d     Secondary diagnosis:  Principal Problem:   Gestational htn w/o significant proteinuria, third trimester Active Problems:   Vacuum-assisted vaginal delivery  Additional problems: ***    Discharge diagnosis: {DX.:23714}                                              Post partum procedures:{Postpartum procedures:23558} Augmentation: {Augmentation:20782} Complications: {OB Labor/Delivery Complications:20784}  Hospital course: {Courses:23701}  Magnesium Sulfate received: {Mag received:30440022} BMZ received: {BMZ received:30440023} Rhophylac:{Rhophylac received:30440032} MMR:{MMR:30440033} T-DaP:{Tdap:23962} Flu: {YQM:57846} Transfusion:{Transfusion received:30440034}  Physical exam  Vitals:   03/20/23 0042 03/20/23 0437 03/20/23 2126 03/21/23 0520  BP: 126/84 133/80 130/87 124/85  Pulse: 84 60 76 75  Resp: 18 18 16 16   Temp: 98.4 F (36.9 C)  98.2 F (36.8 C) 98.5 F (36.9 C)  TempSrc: Oral  Oral Oral  SpO2: 100% 100% 100% 100%  Weight:      Height:       General: {Exam; general:21111117} Lochia: {Desc; appropriate/inappropriate:30686::"appropriate"} Uterine Fundus: {Desc; firm/soft:30687} Incision: {Exam; incision:21111123} DVT Evaluation: {Exam; dvt:2111122} Labs: Lab Results  Component Value Date   WBC 13.8 (H) 03/20/2023   HGB 8.9 (L) 03/20/2023   HCT 26.2 (L) 03/20/2023   MCV 93.9 03/20/2023   PLT 133 (L) 03/20/2023      Latest Ref Rng & Units 03/18/2023    6:16 PM  CMP  Glucose 70 - 99 mg/dL 64   BUN 6 - 20 mg/dL 11   Creatinine 9.62 - 1.00  mg/dL 9.52   Sodium 841 - 324 mmol/L 133   Potassium 3.5 - 5.1 mmol/L 4.0   Chloride 98 - 111 mmol/L 104   CO2 22 - 32 mmol/L 18   Calcium 8.9 - 10.3 mg/dL 9.0   Total Protein 6.5 - 8.1 g/dL 6.8   Total Bilirubin 0.3 - 1.2 mg/dL 0.5   Alkaline Phos 38 - 126 U/L 160   AST 15 - 41 U/L 26   ALT 0 - 44 U/L 19    Edinburgh Score:    03/20/2023    9:35 AM  Edinburgh Postnatal Depression Scale Screening Tool  I have been able to laugh and see the funny side of things. 0  I have looked forward with enjoyment to things. 0  I have blamed myself unnecessarily when things went wrong. 1  I have been anxious or worried for no good reason. 1  I have felt scared or panicky for no good reason. 1  Things have been getting on top of me. 0  I have been so unhappy that I have had difficulty sleeping. 0  I have felt sad or miserable. 0  I have been so unhappy that I have been crying. 1  The thought of harming myself has occurred to me. 0  Edinburgh Postnatal Depression Scale Total 4      After visit meds:  Allergies as of 03/21/2023       Reactions   Strawberry Extract    Strawberries (the fruit)-Hives and tongue edema  Medication List     TAKE these medications    EPINEPHrine 0.3 mg/0.3 mL Soaj injection Commonly known as: EPI-PEN Inject 0.3 mg into the muscle as needed for anaphylaxis.   furosemide 40 MG tablet Commonly known as: LASIX Take 1 tablet (40 mg total) by mouth daily for 3 days. Start taking on: March 22, 2023   ibuprofen 600 MG tablet Commonly known as: ADVIL Take 1 tablet (600 mg total) by mouth every 6 (six) hours as needed.   iron polysaccharides 150 MG capsule Commonly known as: NIFEREX Take 1 capsule (150 mg total) by mouth daily.         Discharge home in stable condition Infant Feeding: {Baby feeding:23562} Infant Disposition:{CHL IP OB HOME WITH GNFAOZ:30865} Discharge instruction: per After Visit Summary and Postpartum booklet. Activity:  Advance as tolerated. Pelvic rest for 6 weeks.  Diet: {OB diet:21111121} Anticipated Birth Control: {Birth Control:23956} Postpartum Appointment:{Outpatient follow up:23559} Additional Postpartum F/U: {PP Procedure:23957} Future Appointments: Future Appointments  Date Time Provider Department Center  04/17/2023  1:00 PM [name], MD [id number]   Follow up Visit:  Follow-up Information     Maxie Better, MD Follow up in 6 week(s).   Specialty: Obstetrics and Gynecology Contact information: [address] [place] [address] [phone]                     03/21/2023 Serita Kyle, MD

## 2023-03-21 NOTE — Progress Notes (Signed)
PPD2 SVD:   S:  Pt reports feeling well/ Tolerating po/ Voiding without problems/ No n/v/ Bleeding is light/ Pain controlled withprescription NSAID's including ibuprofen (Motrin). No longer SOB with lying down  Newborn info  live female BRF   O:  A & O x 3 / VS: Blood pressure 124/85, pulse 75, temperature 98.5 F (36.9 C), temperature source Oral, resp. rate 16, height 5\' 5"  (1.651 m), weight 97.5 kg, SpO2 100%, unknown if currently breastfeeding.  LABS: No results found for this or any previous visit (from the past 24 hour(s)).  I&O: No intake/output data recorded.   No intake/output data recorded.  Lungs: chest clear, no wheezing, rales, normal symmetric air entry  Heart: regular rate and rhythm, S1, S2 normal, no murmur, click, rub or gallop  Abdomen: uterus firm at umb  Perineum: is normal  Lochia: light  Extremities:no redness or tenderness in the calves or thighs, edema tr-1+    A/P: PPD # 2/ G1P1001 Mild preeclampsia  Doing well  Continue routine post partum orders  D/cv home Continue lasix for a few more days D/c instructions reviewed F/u 6 wk

## 2023-03-21 NOTE — Lactation Note (Signed)
This note was copied from a baby's chart. Lactation Consultation Note  Patient Name: Natasha Ross [id number] Date: 03/21/2023 Age:27 years  Reason for consult: Follow-up assessment;Infant < 6lbs;Term;Primapara;1st time breastfeeding  P1, [redacted]w[redacted]d, SGA, 5.28% weight loss  Mother states she attempted to breastfeed last night. Baby has mostly been formula feeding, taking 23 ml and tolerating bottles well. Mother pumped this am and expressed a few drops. Mother is holding baby skin to skin while infant is sleeping. Mother is hopeful that she and baby are being discharged to home today. Mother's feeding plans are to continue to supplement with formula/ EBM, pump and work on breastfeeding.    Discussed the process of milk production, "supply and demand" and the importance of breast stimulation and milk removal in order to make an optimal milk supply.   Discussed with mother to breastfeed 8-12 times in 24 hours, skin to skin and breast feed before formula feeding.    If missed feedings at breast or substituting feeding with formula, advised to hand express and/or pump to remove milk from the breast.   Pump 8 times/24 hrs if baby is not not effectively feeding from breast to establish milk supply.     Mother verbalized understanding. Has breast pump at home and states she has the ability to hand express.   Feeding Mother's Current Feeding Choice: Breast Milk and Formula Nipple Type: Nfant Standard Flow (white)   Lactation Tools Discussed/Used Tools: Pump;Flanges Pumped volume: 3 mL  Interventions Interventions: Education  Discharge Discharge Education: Engorgement and breast care;Warning signs for feeding baby;Other (comment) (discussed benefits of an OP Lac Consultation to establish breastfeeding)  Consult Status Consult Status: Complete Date: 03/21/23 [facility] will follow if infant is not discharged)    Omar Person 03/21/2023, 12:54 PM

## 2023-04-17 ENCOUNTER — Encounter: Payer: Self-pay | Admitting: Family Medicine

## 2023-04-17 ENCOUNTER — Ambulatory Visit: Payer: Medicaid Other | Admitting: Family Medicine

## 2023-04-17 VITALS — BP 120/70 | HR 108 | Temp 98.8°F | Ht 65.0 in | Wt 189.0 lb

## 2023-04-17 DIAGNOSIS — F411 Generalized anxiety disorder: Secondary | ICD-10-CM

## 2023-04-17 DIAGNOSIS — F320 Major depressive disorder, single episode, mild: Secondary | ICD-10-CM | POA: Diagnosis not present

## 2023-04-17 DIAGNOSIS — O99345 Other mental disorders complicating the puerperium: Secondary | ICD-10-CM

## 2023-04-17 DIAGNOSIS — F418 Other specified anxiety disorders: Secondary | ICD-10-CM

## 2023-04-17 DIAGNOSIS — Z8759 Personal history of other complications of pregnancy, childbirth and the puerperium: Secondary | ICD-10-CM

## 2023-04-17 MED ORDER — SERTRALINE HCL 25 MG PO TABS
25.0000 mg | ORAL_TABLET | Freq: Every day | ORAL | 3 refills | Status: DC
Start: 2023-04-17 — End: 2023-08-26

## 2023-04-17 NOTE — Progress Notes (Signed)
Established Patient Office Visit   Subjective  Patient ID: Natasha Ross, female    DOB: [birth year]  Age: 27 y.o. MRN: [id number]  Chief Complaint  Patient presents with   Medical Management of Chronic Issues    Post-pregnancy     Pt is a 27 yo female seen for f/u.  Pt was induced due to gHTN.  Had a healthy baby girl on 03/19/23. Currently breast feeding.  On maternity leave, but planning to go back to work in a few wks.  BP has been controlled.  Things are going well, but pt has noticed increased anxiety.  Pt notes feeling afraid to be out alone with baby like today.  Pt states her husband went back to work today.  Pt managing but does not want things to get worse when she is back at work.  Previously on Zoloft 50 mg daily.    Past Medical History:  Diagnosis Date   Anxiety    Depression    No past surgical history on file. Social History   Tobacco Use   Smoking status: Never  Substance Use Topics   Alcohol use: Not Currently    Comment: social drinking   Drug use: Never   Family History  Problem Relation Age of Onset   Hypertension Mother    Stroke Mother    Diabetes Mother    Depression Mother    Heart attack Mother    High Cholesterol Mother    Mental illness Mother    Miscarriages / [place] Mother    Mental illness Father    Hypertension Father    Depression Father    Diabetes Father    Asthma Father    Alcohol abuse Father    Depression Maternal Grandmother    High Cholesterol Maternal Grandmother    Cancer Maternal Grandmother        breast cancer   Asthma Maternal Grandmother    Breast cancer Maternal Grandmother    Heart disease Maternal Grandmother    Stroke Maternal Grandfather    Mental illness Maternal Grandfather    Hypertension Maternal Grandfather    High Cholesterol Maternal Grandfather    Heart attack Maternal Grandfather    Diabetes Maternal Grandfather    Cancer Maternal Grandfather        prostate cancer   Asthma Maternal  Grandfather    Arthritis Maternal Grandfather    COPD Maternal Grandfather    Kidney disease Maternal Grandfather    Cancer Paternal Grandmother        breast cancer   Allergies  Allergen Reactions   Strawberry Extract     Strawberries (the fruit)-Hives and tongue edema      ROS Negative unless stated above    Objective:     BP 120/70 (BP Location: Right Arm, Patient Position: Sitting, Cuff Size: Normal)   Pulse (!) 108   Temp 98.8 F (37.1 C) (Oral)   Ht 5\' 5"  (1.651 m)   Wt 189 lb (85.7 kg)   LMP  (LMP Unknown)   SpO2 95%   Breastfeeding Yes   BMI 31.45 kg/m  BP Readings from Last 3 Encounters:  04/17/23 120/70  03/21/23 124/85  01/16/23 112/68   Wt Readings from Last 3 Encounters:  04/17/23 189 lb (85.7 kg)  03/18/23 215 lb (97.5 kg)  01/16/23 190 lb 3.2 oz (86.3 kg)      Physical Exam Constitutional:      General: She is not in acute distress.  Appearance: Normal appearance.  HENT:     Head: Normocephalic and atraumatic.     Nose: Nose normal.     Mouth/Throat:     Mouth: Mucous membranes are moist.  Cardiovascular:     Rate and Rhythm: Normal rate and regular rhythm.     Heart sounds: Normal heart sounds. No murmur heard.    No gallop.  Pulmonary:     Effort: Pulmonary effort is normal. No respiratory distress.     Breath sounds: Normal breath sounds. No wheezing, rhonchi or rales.  Abdominal:     General: Bowel sounds are normal.     Palpations: Abdomen is soft.  Skin:    General: Skin is warm and dry.  Neurological:     Mental Status: She is alert and oriented to person, place, and time.       01/16/2023    1:07 PM  GAD 7 : Generalized Anxiety Score  Nervous, Anxious, on Edge 3  Control/stop worrying 3  Worry too much - different things 3  Trouble relaxing 3  Restless 3  Easily annoyed or irritable 3  Afraid - awful might happen 3  Total GAD 7 Score 21  Anxiety Difficulty Very difficult       01/16/2023    1:07 PM  Depression  screen PHQ 2/9  Decreased Interest 1  Down, Depressed, Hopeless 2  PHQ - 2 Score 3  Altered sleeping 3  Tired, decreased energy 3  Change in appetite 3  Feeling bad or failure about yourself  0  Trouble concentrating 2  Moving slowly or fidgety/restless 0  Suicidal thoughts 0  PHQ-9 Score 14  Difficult doing work/chores Very difficult     No results found for any visits on 04/17/23.    Assessment & Plan:  GAD (generalized anxiety disorder) -     Sertraline HCl; Take 1 tablet (25 mg total) by mouth daily.  Dispense: 30 tablet; Refill: 3  Postpartum anxiety -GAD7 score 21 -PHQ 9 score 14 -discussed r/b/a medications while breastfeeding as medication can pass through breast milk to infant.   -will restart zoloft at 25 mg daily. -self care encouraged. -consider new mother support groups/counseling -f/u in 4-6 kws, sooner if needed -given strict precautions -     Sertraline HCl; Take 1 tablet (25 mg total) by mouth daily.  Dispense: 30 tablet; Refill: 3  History of gestational hypertension -controlled -not currently on meds.  Continue to monitor.  For elevation start procardia xl.  Depression, current, mild -Phq 9 score 14 -restart zoloft   Return in about 5 weeks (around 05/22/2023).   [name], MD

## 2023-04-17 NOTE — Patient Instructions (Signed)
You may notice irritability and changes in sleep, feeding patterns in baby from the zoloft.

## 2023-04-21 ENCOUNTER — Telehealth (HOSPITAL_COMMUNITY): Payer: Self-pay | Admitting: *Deleted

## 2023-04-21 NOTE — Telephone Encounter (Signed)
04/21/2023  Name: Adhya Cocco MRN: [id number] DOB: 08-24-1995  Reason for Call:  Transition of Care Hospital Discharge Call  Contact Status: Patient Contact Status: Message  Language assistant needed:          Follow-Up Questions:    Inocente Salles Postnatal Depression Scale:  In the Past 7 Days:    PHQ2-9 Depression Scale:     Discharge Follow-up:    Post-discharge interventions: NA  Salena Saner, RN 04/21/2023 11:17

## 2023-05-19 ENCOUNTER — Encounter: Payer: Self-pay | Admitting: Family Medicine

## 2023-05-22 ENCOUNTER — Ambulatory Visit: Payer: Medicaid Other | Admitting: Family Medicine

## 2023-05-28 ENCOUNTER — Ambulatory Visit: Payer: Medicaid Other | Admitting: Family Medicine

## 2023-08-14 ENCOUNTER — Ambulatory Visit: Payer: Medicaid Other | Admitting: Family Medicine

## 2023-08-19 ENCOUNTER — Ambulatory Visit: Payer: Medicaid Other | Admitting: Family Medicine

## 2023-08-26 ENCOUNTER — Telehealth: Payer: Medicaid Other | Admitting: Family Medicine

## 2023-08-26 ENCOUNTER — Encounter: Payer: Self-pay | Admitting: Family Medicine

## 2023-08-26 VITALS — BP 126/82

## 2023-08-26 DIAGNOSIS — F53 Postpartum depression: Secondary | ICD-10-CM

## 2023-08-26 DIAGNOSIS — F411 Generalized anxiety disorder: Secondary | ICD-10-CM

## 2023-08-26 DIAGNOSIS — F321 Major depressive disorder, single episode, moderate: Secondary | ICD-10-CM

## 2023-08-26 MED ORDER — SERTRALINE HCL 50 MG PO TABS: 50.0000 mg | ORAL_TABLET | Freq: Every day | ORAL | 0 refills | Status: DC

## 2023-08-26 NOTE — Progress Notes (Unsigned)
Virtual Visit via Video Note  I connected withNAME@ on 08/26/23 at  3:45 PM EST by a video enabled telemedicine application and verified that I am speaking with the correct person using two identifiers.  Location patient: home Location provider:work or home office Persons participating in the virtual visit: patient, provider  I discussed the limitations of evaluation and management by telemedicine and the availability of in person appointments. The patient expressed understanding and agreed to proceed.  Chief Complaint  Patient presents with   Medication Refill    Zoloft and birth control     HPI: Pt is a 28 yo female seen for acute concerns. Pt noticing increased anxiety causing panic attacks at work 1-1.5 mo.  PT unsure of what is causing them.  She did just have a baby 5 months ago.  Pt denies SI.   Pt forgetting to eat, hasn't showered in a while.  Unsure what day it is.  Was forgetting to take zoloft.  Started taking for 2 wks consistently but felt like it decreased her appetite, and made her have crying spells.     Pt has a therapist.  Sees weekly.    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     ROS: See pertinent positives and negatives per HPI.  Past Medical History:  Diagnosis Date   Anxiety    Depression     History reviewed. No pertinent surgical history.  Family History  Problem Relation Age of Onset   Hypertension Mother    Stroke Mother    Diabetes Mother    Depression Mother    Heart attack Mother    High Cholesterol Mother    Mental illness Mother    Miscarriages / [place] Mother    Mental illness Father    Hypertension Father    Depression Father    Diabetes Father    Asthma Father    Alcohol abuse Father    Depression Maternal Grandmother    High Cholesterol Maternal Grandmother    Cancer Maternal Grandmother        breast cancer   Asthma Maternal Grandmother    Breast cancer Maternal Grandmother    Heart disease Maternal Grandmother     Stroke Maternal Grandfather    Mental illness Maternal Grandfather    Hypertension Maternal Grandfather    High Cholesterol Maternal Grandfather    Heart attack Maternal Grandfather    Diabetes Maternal Grandfather    Cancer Maternal Grandfather        prostate cancer   Asthma Maternal Grandfather    Arthritis Maternal Grandfather    COPD Maternal Grandfather    Kidney disease Maternal Grandfather    Cancer Paternal Grandmother        breast cancer      Current Outpatient Medications:    cholecalciferol (VITAMIN D3) 25 MCG (1000 UNIT) tablet, Take 1,000 Units by mouth daily., Disp: , Rfl:    EPINEPHrine 0.3 mg/0.3 mL IJ SOAJ injection, Inject 0.3 mg into the muscle as needed for anaphylaxis., Disp: 2 each, Rfl: 1   ibuprofen (ADVIL) 600 MG tablet, Take 1 tablet (600 mg total) by mouth every 6 (six) hours as needed., Disp: 30 tablet, Rfl: 11   Prenatal Vit-Fe Fumarate-FA (PRENATAL MULTIVITAMIN) TABS tablet, Take 1 tablet by mouth daily at 12 noon., Disp: , Rfl:    sertraline (ZOLOFT) 25 MG tablet, Take 1 tablet (25 mg total) by mouth daily. (Patient not taking: Reported on 08/26/2023), Disp: 30 tablet, Rfl: 3  EXAM:  VITALS per patient if applicable:  RR between 12-20 bpm  GENERAL: alert, oriented, appears well and in no acute distress  HEENT: atraumatic, conjunctiva clear, no obvious abnormalities on inspection of external nose and ears  NECK: normal movements of the head and neck  LUNGS: on inspection no signs of respiratory distress, breathing rate appears normal, no obvious gross SOB, gasping or wheezing  CV: no obvious cyanosis  MS: moves all visible extremities without noticeable abnormality  PSYCH/NEURO: pleasant and cooperative, no obvious depression or anxiety, speech and thought processing grossly intact     08/26/2023    3:38 PM 01/16/2023    1:07 PM  Depression screen PHQ 2/9  Decreased Interest 3 1  Down, Depressed, Hopeless 3 2  PHQ - 2 Score 6 3  Altered  sleeping 3 3  Tired, decreased energy 3 3  Change in appetite 0 3  Feeling bad or failure about yourself  0 0  Trouble concentrating 3 2  Moving slowly or fidgety/restless 0 0  Suicidal thoughts 0 0  PHQ-9 Score 15 14  Difficult doing work/chores  Very difficult      08/26/2023    3:40 PM 01/16/2023    1:07 PM  GAD 7 : Generalized Anxiety Score  Nervous, Anxious, on Edge 3 3  Control/stop worrying 3 3  Worry too much - different things 3 3  Trouble relaxing 3 3  Restless 2 3  Easily annoyed or irritable 3 3  Afraid - awful might happen 3 3  Total GAD 7 Score 20 21  Anxiety Difficulty  Very difficult    ASSESSMENT AND PLAN:  Discussed the following assessment and plan:  GAD (generalized anxiety disorder) - Plan: sertraline (ZOLOFT) 50 MG tablet  Depression, major, single episode, moderate (HCC) - Plan: sertraline (ZOLOFT) 50 MG tablet     I discussed the assessment and treatment plan with the patient. The patient was provided an opportunity to ask questions and all were answered. The patient agreed with the plan and demonstrated an understanding of the instructions.   The patient was advised to call back or seek an in-person evaluation if the symptoms worsen or if the condition fails to improve as anticipated.   Natasha Saint, MD

## 2024-01-29 ENCOUNTER — Ambulatory Visit: Admitting: Family Medicine

## 2024-02-04 ENCOUNTER — Ambulatory Visit: Admitting: Family Medicine

## 2024-06-24 ENCOUNTER — Ambulatory Visit (INDEPENDENT_AMBULATORY_CARE_PROVIDER_SITE_OTHER): Admitting: Family Medicine

## 2024-06-24 ENCOUNTER — Encounter: Payer: Self-pay | Admitting: Family Medicine

## 2024-06-24 VITALS — BP 118/80 | HR 70 | Temp 98.5°F | Ht 65.0 in | Wt 181.0 lb

## 2024-06-24 DIAGNOSIS — F418 Other specified anxiety disorders: Secondary | ICD-10-CM

## 2024-06-24 DIAGNOSIS — F341 Dysthymic disorder: Secondary | ICD-10-CM | POA: Diagnosis not present

## 2024-06-24 DIAGNOSIS — F53 Postpartum depression: Secondary | ICD-10-CM

## 2024-06-24 DIAGNOSIS — F411 Generalized anxiety disorder: Secondary | ICD-10-CM | POA: Diagnosis not present

## 2024-06-24 DIAGNOSIS — Z0000 Encounter for general adult medical examination without abnormal findings: Secondary | ICD-10-CM

## 2024-06-24 DIAGNOSIS — Z13220 Encounter for screening for lipoid disorders: Secondary | ICD-10-CM | POA: Diagnosis not present

## 2024-06-24 DIAGNOSIS — Z0001 Encounter for general adult medical examination with abnormal findings: Secondary | ICD-10-CM

## 2024-06-24 DIAGNOSIS — Z113 Encounter for screening for infections with a predominantly sexual mode of transmission: Secondary | ICD-10-CM

## 2024-06-24 DIAGNOSIS — R0683 Snoring: Secondary | ICD-10-CM

## 2024-06-24 DIAGNOSIS — Z131 Encounter for screening for diabetes mellitus: Secondary | ICD-10-CM

## 2024-06-24 LAB — COMPREHENSIVE METABOLIC PANEL WITH GFR
ALT: 13 U/L (ref 0–35)
AST: 18 U/L (ref 0–37)
Albumin: 4.8 g/dL (ref 3.5–5.2)
Alkaline Phosphatase: 61 U/L (ref 39–117)
BUN: 8 mg/dL (ref 6–23)
CO2: 26 meq/L (ref 19–32)
Calcium: 9.8 mg/dL (ref 8.4–10.5)
Chloride: 104 meq/L (ref 96–112)
Creatinine, Ser: 0.82 mg/dL (ref 0.40–1.20)
GFR: 98.09 mL/min (ref >60.00)
Glucose, Bld: 78 mg/dL (ref 70–99)
Potassium: 4.1 meq/L (ref 3.5–5.1)
Sodium: 138 meq/L (ref 135–145)
Total Bilirubin: 0.3 mg/dL (ref 0.2–1.2)
Total Protein: 8.7 g/dL — ABNORMAL HIGH (ref 6.0–8.3)

## 2024-06-24 LAB — LIPID PANEL
Cholesterol: 190 mg/dL (ref 0–200)
HDL: 62.8 mg/dL (ref >39.00)
LDL Cholesterol: 117 mg/dL — ABNORMAL HIGH (ref 0–99)
NonHDL: 127.41
Total CHOL/HDL Ratio: 3
Triglycerides: 52 mg/dL (ref 0.0–149.0)
VLDL: 10.4 mg/dL (ref 0.0–40.0)

## 2024-06-24 LAB — CBC WITH DIFFERENTIAL/PLATELET
Basophils Absolute: 0 K/uL (ref 0.0–0.1)
Basophils Relative: 0.6 % (ref 0.0–3.0)
Eosinophils Absolute: 0.4 K/uL (ref 0.0–0.7)
Eosinophils Relative: 6.7 % — ABNORMAL HIGH (ref 0.0–5.0)
HCT: 34.7 % — ABNORMAL LOW (ref 36.0–46.0)
Hemoglobin: 11.8 g/dL — ABNORMAL LOW (ref 12.0–15.0)
Lymphocytes Relative: 31.4 % (ref 12.0–46.0)
Lymphs Abs: 1.8 K/uL (ref 0.7–4.0)
MCHC: 33.9 g/dL (ref 30.0–36.0)
MCV: 90.8 fl (ref 78.0–100.0)
Monocytes Absolute: 0.4 K/uL (ref 0.1–1.0)
Monocytes Relative: 6.5 % (ref 3.0–12.0)
Neutro Abs: 3.2 K/uL (ref 1.4–7.7)
Neutrophils Relative %: 54.8 % (ref 43.0–77.0)
Platelets: 342 K/uL (ref 150.0–400.0)
RBC: 3.82 Mil/uL — ABNORMAL LOW (ref 3.87–5.11)
RDW: 13.2 % (ref 11.5–15.5)
WBC: 5.8 K/uL (ref 4.0–10.5)

## 2024-06-24 LAB — TSH: TSH: 1.09 u[IU]/mL (ref 0.35–5.50)

## 2024-06-24 LAB — VITAMIN D 25 HYDROXY (VIT D DEFICIENCY, FRACTURES): VITD: 19.62 ng/mL — ABNORMAL LOW (ref 30.00–100.00)

## 2024-06-24 LAB — T4, FREE: Free T4: 0.91 ng/dL (ref 0.60–1.60)

## 2024-06-24 LAB — HEMOGLOBIN A1C: Hgb A1c MFr Bld: 5.3 % (ref 4.6–6.5)

## 2024-06-24 MED ORDER — ESCITALOPRAM OXALATE 10 MG PO TABS: 10.0000 mg | ORAL_TABLET | Freq: Every day | ORAL | 0 refills | Status: AC

## 2024-06-24 NOTE — Progress Notes (Signed)
 "  Established Patient Office Visit   Subjective  Patient ID: Natasha Ross, female    DOB: June 21, 1996  Age: 28 y.o. MRN: [id number]  Chief Complaint  Patient presents with   Annual Exam    Patient wants to increase antidepressant.     Patient is a 28 year old female seen for CPE and follow-up.  Patient endorses continued anxiety and depression symptoms due to various stressors, including work and personal fears. Woke up last night worried about being homeless.  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 Fears being kidnapped. Sees a therapist q Friday.   On Zoloft  x yrs, taking 25 mg daily after breakfast otherwise it will decrease her appetite.  Tried 50 mg but it caused grogginess and/or  nausea. Started med at age 74.  Stopped while pregnant and w/ breastfeeding.  Pt had an episode of chest tightness while sending an email at work.  It lasted 20 min .  Did not feel anxious at the time.  Patient endorses being told she snores and at times stops breathing by her husband.  Does not take naps during the day.  Wakes up unrested but unsure if it is related to insomnia.  Pt in bed around 9 and up around 2 AM.  Unable to fall back asleep until 4 AM.  Has to be up by 7 AM for work.    Patient Active Problem List   Diagnosis Date Noted   Vacuum-assisted vaginal delivery 03/19/2023   Gestational htn w/o significant proteinuria, third trimester 03/18/2023   MDD (major depressive disorder) 02/17/2019   Past Medical History:  Diagnosis Date   Anxiety    Depression    History reviewed. No pertinent surgical history. Social History[1] Family History  Problem Relation Age of Onset   Hypertension Mother    Stroke Mother    Diabetes Mother    Depression Mother    Heart attack Mother    High Cholesterol Mother    Mental illness Mother    Miscarriages / Stillbirths Mother    Mental illness Father    Hypertension Father    Depression Father     Diabetes Father    Asthma Father    Alcohol abuse Father    Depression Maternal Grandmother    High Cholesterol Maternal Grandmother    Cancer Maternal Grandmother        breast cancer   Asthma Maternal Grandmother    Breast cancer Maternal Grandmother    Heart disease Maternal Grandmother    Stroke Maternal Grandfather    Mental illness Maternal Grandfather    Hypertension Maternal Grandfather    High Cholesterol Maternal Grandfather    Heart attack Maternal Grandfather    Diabetes Maternal Grandfather    Cancer Maternal Grandfather        prostate cancer   Asthma Maternal Grandfather    Arthritis Maternal Grandfather    COPD Maternal Grandfather    Kidney disease Maternal Grandfather    Cancer Paternal Grandmother        breast cancer   Allergies[2]  ROS Negative unless stated above    Objective:     BP 118/80 (BP Location: Left Arm, Patient Position: Sitting, Cuff Size: Normal)   Pulse 70   Temp 98.5 F (36.9 C) (Oral)   Ht 5' 5 (1.651 m)   Wt 181 lb (82.1 kg)   LMP 06/21/2024 (Exact Date)   SpO2 99%   BMI 30.12 kg/m  BP  Readings from Last 3 Encounters:  06/24/24 118/80  08/26/23 126/82  04/17/23 120/70   Wt Readings from Last 3 Encounters:  06/24/24 181 lb (82.1 kg)  04/17/23 189 lb (85.7 kg)  03/18/23 215 lb (97.5 kg)      Physical Exam Constitutional:      Appearance: Normal appearance.  HENT:     Head: Normocephalic and atraumatic.     Right Ear: Tympanic membrane, ear canal and external ear normal.     Left Ear: Tympanic membrane, ear canal and external ear normal.     Nose: Nose normal.     Mouth/Throat:     Mouth: Mucous membranes are moist.     Pharynx: No oropharyngeal exudate or posterior oropharyngeal erythema.  Eyes:     General: No scleral icterus.    Extraocular Movements: Extraocular movements intact.     Conjunctiva/sclera: Conjunctivae normal.     Pupils: Pupils are equal, round, and reactive to light.  Neck:     Thyroid :  No thyromegaly.     Vascular: No carotid bruit.  Cardiovascular:     Rate and Rhythm: Normal rate and regular rhythm.     Pulses: Normal pulses.     Heart sounds: Normal heart sounds. No murmur heard.    No friction rub.  Pulmonary:     Effort: Pulmonary effort is normal.     Breath sounds: Normal breath sounds. No wheezing, rhonchi or rales.  Abdominal:     General: Bowel sounds are normal.     Palpations: Abdomen is soft.     Tenderness: There is no abdominal tenderness.  Musculoskeletal:        General: No deformity. Normal range of motion.  Lymphadenopathy:     Cervical: No cervical adenopathy.  Skin:    General: Skin is warm and dry.     Findings: No lesion.  Neurological:     General: No focal deficit present.     Mental Status: She is alert and oriented to person, place, and time.  Psychiatric:        Mood and Affect: Mood normal.        Thought Content: Thought content normal.        06/24/2024    9:46 AM 08/26/2023    3:38 PM 01/16/2023    1:07 PM  Depression screen PHQ 2/9  Decreased Interest 2 3 1   Down, Depressed, Hopeless 3 3 2   PHQ - 2 Score 5 6 3   Altered sleeping 3 3 3   Tired, decreased energy 3 3 3   Change in appetite 3 0 3  Feeling bad or failure about yourself  0 0 0  Trouble concentrating 2 3 2   Moving slowly or fidgety/restless 0 0 0  Suicidal thoughts 0 0 0  PHQ-9 Score 16 15  14    Difficult doing work/chores Very difficult  Very difficult     Data saved with a previous flowsheet row definition      06/24/2024    9:47 AM 08/26/2023    3:40 PM 01/16/2023    1:07 PM  GAD 7 : Generalized Anxiety Score  Nervous, Anxious, on Edge 3 3 3   Control/stop worrying 3 3 3   Worry too much - different things 3 3 3   Trouble relaxing 3 3 3   Restless 2 2 3   Easily annoyed or irritable 3 3 3   Afraid - awful might happen 3 3 3   Total GAD 7 Score 20 20 21   Anxiety Difficulty Extremely difficult  Very difficult     No results found for any visits on  06/24/24.    Assessment & Plan:   Well adult exam -     Comprehensive metabolic panel with GFR; Future -     CBC with Differential/Platelet; Future -     TSH; Future -     T4, free; Future -     Hemoglobin A1c; Future -     Lipid panel; Future  Persistent depressive disorder -     VITAMIN D  25 Hydroxy (Vit-D Deficiency, Fractures); Future -     Escitalopram  Oxalate; Take 1 tablet (10 mg total) by mouth daily.  Dispense: 90 tablet; Refill: 0  GAD (generalized anxiety disorder) -     Escitalopram  Oxalate; Take 1 tablet (10 mg total) by mouth daily.  Dispense: 90 tablet; Refill: 0  Routine screening for STI (sexually transmitted infection) -     RPR W/RFLX TO RPR TITER, TREPONEMAL AB, SCREEN AND DIAGNOSIS; Future -     HIV Antibody (routine testing w rflx); Future -     C. trachomatis/N. gonorrhoeae RNA; Future  Snoring -     Pulmonary Visit  Age-appropriate health screenings discussed.  Obtain labs.  Immunizations reviewed and up-to-date.  Pap with OB/GYN, Dr. Rutherford.  Patient with a history of increased anxiety and depression, initially worsened post partum and continued ever since.  PHQ-9 score 16 and GAD-7 score 20 this visit.  Currently on Zoloft  25 mg.  Increased dose in the past caused grogginess and nausea.  Will stop Zoloft  and start Lexapro  10 mg daily.  Will have patient follow-up in 4 to 6 weeks to assess medication.  Continue counseling weekly.  Referral to pulmonology for snoring and possible OSA.  Given precautions.  Return in about 5 weeks (around 07/29/2024) for chronic conditions.   Clotilda JONELLE Single, MD     [1]  Social History Tobacco Use   Smoking status: Never  Substance Use Topics   Alcohol use: Not Currently    Comment: social drinking   Drug use: Never  [2]  Allergies Allergen Reactions   Strawberry Extract     Strawberries (the fruit)-Hives and tongue edema   "

## 2024-06-26 LAB — C. TRACHOMATIS/N. GONORRHOEAE RNA
C. trachomatis RNA, TMA: NOT DETECTED
N. gonorrhoeae RNA, TMA: NOT DETECTED

## 2024-06-26 LAB — SYPHILIS: RPR W/REFLEX TO RPR TITER AND TREPONEMAL ANTIBODIES, TRADITIONAL SCREENING AND DIAGNOSIS ALGORITHM: RPR Ser Ql: NONREACTIVE

## 2024-06-26 LAB — HIV ANTIBODY (ROUTINE TESTING W REFLEX)
HIV 1&2 Ab, 4th Generation: NONREACTIVE
HIV FINAL INTERPRETATION: NEGATIVE

## 2024-06-27 ENCOUNTER — Ambulatory Visit: Payer: Self-pay | Admitting: Family Medicine

## 2024-06-27 DIAGNOSIS — E559 Vitamin D deficiency, unspecified: Secondary | ICD-10-CM

## 2024-06-27 DIAGNOSIS — R778 Other specified abnormalities of plasma proteins: Secondary | ICD-10-CM

## 2024-06-27 MED ORDER — VITAMIN D (ERGOCALCIFEROL) 1.25 MG (50000 UNIT) PO CAPS: 50000.0000 [IU] | ORAL_CAPSULE | ORAL | 0 refills | Status: AC

## 2024-06-30 ENCOUNTER — Other Ambulatory Visit (INDEPENDENT_AMBULATORY_CARE_PROVIDER_SITE_OTHER)

## 2024-06-30 DIAGNOSIS — R778 Other specified abnormalities of plasma proteins: Secondary | ICD-10-CM | POA: Diagnosis not present

## 2024-06-30 LAB — PROTEIN, TOTAL: Total Protein: 8.3 g/dL (ref 6.0–8.3)

## 2024-06-30 NOTE — Addendum Note (Signed)
 Addended by: BRIEN SONG A on: 06/30/2024 08:57 AM   Modules accepted: Orders

## 2024-07-01 ENCOUNTER — Ambulatory Visit: Admitting: Family Medicine

## 2024-07-01 ENCOUNTER — Ambulatory Visit: Payer: Self-pay | Admitting: Family Medicine

## 2024-07-05 LAB — PROTEIN ELECTROPHORESIS, SERUM
Albumin ELP: 4.5 g/dL (ref 3.8–4.8)
Alpha 1: 0.3 g/dL (ref 0.2–0.3)
Alpha 2: 0.7 g/dL (ref 0.5–0.9)
Beta 2: 0.4 g/dL (ref 0.2–0.5)
Beta Globulin: 0.5 g/dL (ref 0.4–0.6)
Gamma Globulin: 1.7 g/dL (ref 0.8–1.7)
Total Protein: 8.1 g/dL (ref 6.1–8.1)

## 2024-07-20 ENCOUNTER — Ambulatory Visit: Admitting: Family Medicine

## 2024-07-25 ENCOUNTER — Encounter: Payer: Self-pay | Admitting: Pulmonary Disease

## 2024-07-25 ENCOUNTER — Ambulatory Visit: Admitting: Pulmonary Disease

## 2024-07-25 ENCOUNTER — Ambulatory Visit: Admitting: Family Medicine

## 2024-07-25 VITALS — BP 128/72 | HR 76 | Temp 97.9°F | Ht 66.0 in | Wt 177.2 lb

## 2024-07-25 DIAGNOSIS — R5383 Other fatigue: Secondary | ICD-10-CM

## 2024-07-25 DIAGNOSIS — R0683 Snoring: Secondary | ICD-10-CM

## 2024-07-25 DIAGNOSIS — F53 Postpartum depression: Secondary | ICD-10-CM | POA: Diagnosis not present

## 2024-07-25 DIAGNOSIS — G4710 Hypersomnia, unspecified: Secondary | ICD-10-CM

## 2024-07-25 NOTE — Patient Instructions (Signed)
 We will schedule you for a home sleep study  Update you with results as soon as reviewed  Tentative follow-up in about 3 months  Weight loss efforts as tolerated

## 2024-07-25 NOTE — Progress Notes (Addendum)
 "              Natasha Ross    [id number]    01-14-1996  Primary Care Physician:Banks, Clotilda SAUNDERS, MD  Referring Physician: Mercer Clotilda SAUNDERS, MD [address] [place],  [place] [address]  Chief complaint:   Patient with snoring, daytime sleepiness, nonrestorative sleep  Discussed the use of AI scribe software for clinical note transcription with the patient, who gave verbal consent to proceed.  History of Present Illness Natasha Ross is a 29 year old female who presents with sleep disturbances and loud snoring.  She has been experiencing difficulty with breathing during sleep since her pregnancy approximately a year and a half ago. Her husband has noted that her snoring has become significantly louder, and she sometimes experiences apneic episodes during sleep. Sleeping on her left side reduces the snoring, but it becomes extremely loud when she rolls onto her back.  She experiences difficulty breathing even when awake, which she attributes to anxiety. She does not wake up feeling rested and often has a dry mouth in the mornings. No morning headaches or unusual night sweats. She wakes up at night to drink water due to dry mouth.  Her sleep schedule typically involves going to bed around 9:30 PM and waking up at 7:00 AM. She has a history of using Ambien  for sleep difficulties, which she discontinued once her sleep improved. She is currently on Lexapro  for the past month, but her symptoms have not changed with its use.  Her weight before pregnancy was approximately 170 pounds, and she currently weighs 177 pounds. She has no history of smoking.  Both of her parents are known to snore heavily.  Nonrestrictive sleep, gasping respirations at night, snoring worsened since pregnancy Almost always wakes up not feeling rejuvenated   Outpatient Encounter Medications as of 07/25/2024  Medication Sig   cholecalciferol (VITAMIN D3) 25 MCG (1000 UNIT) tablet Take 1,000  Units by mouth daily.   EPINEPHrine  0.3 mg/0.3 mL IJ SOAJ injection Inject 0.3 mg into the muscle as needed for anaphylaxis.   escitalopram  (LEXAPRO ) 10 MG tablet Take 1 tablet (10 mg total) by mouth daily.   ibuprofen  (ADVIL ) 600 MG tablet Take 1 tablet (600 mg total) by mouth every 6 (six) hours as needed.   Prenatal Vit-Fe Fumarate-FA (PRENATAL MULTIVITAMIN) TABS tablet Take 1 tablet by mouth daily at 12 noon.   Vitamin D , Ergocalciferol , (DRISDOL ) 1.25 MG (50000 UNIT) CAPS capsule Take 1 capsule (50,000 Units total) by mouth every 7 (seven) days.   No facility-administered encounter medications on file as of 07/25/2024.    Allergies as of 07/25/2024 - Review Complete 07/25/2024  Allergen Reaction Noted   Strawberry extract  01/16/2023    Past Medical History:  Diagnosis Date   Anxiety    Depression     No past surgical history on file.  Family History  Problem Relation Age of Onset   Hypertension Mother    Stroke Mother    Diabetes Mother    Depression Mother    Heart attack Mother    High Cholesterol Mother    Mental illness Mother    Miscarriages / Stillbirths Mother    Mental illness Father    Hypertension Father    Depression Father    Diabetes Father    Asthma Father    Alcohol abuse Father    Depression Maternal Grandmother    High Cholesterol Maternal Grandmother    Cancer Maternal Grandmother  breast cancer   Asthma Maternal Grandmother    Breast cancer Maternal Grandmother    Heart disease Maternal Grandmother    Stroke Maternal Grandfather    Mental illness Maternal Grandfather    Hypertension Maternal Grandfather    High Cholesterol Maternal Grandfather    Heart attack Maternal Grandfather    Diabetes Maternal Grandfather    Cancer Maternal Grandfather        prostate cancer   Asthma Maternal Grandfather    Arthritis Maternal Grandfather    COPD Maternal Grandfather    Kidney disease Maternal Grandfather    Cancer Paternal Grandmother         breast cancer    Social History   Socioeconomic History   Marital status: Married    Spouse name: Jerona   Number of children: Not on file   Years of education: Not on file   Highest education level: Not on file  Occupational History   Not on file  Tobacco Use   Smoking status: Never   Smokeless tobacco: Not on file  Substance and Sexual Activity   Alcohol use: Not Currently    Comment: social drinking   Drug use: Never   Sexual activity: Yes    Birth control/protection: None  Other Topics Concern   Not on file  Social History Narrative   Not on file   Social Drivers of Health   Tobacco Use: Unknown (07/25/2024)   Patient History    Smoking Tobacco Use: Never    Smokeless Tobacco Use: Unknown    Passive Exposure: Not on file  Financial Resource Strain: Not on file  Food Insecurity: No Food Insecurity (03/18/2023)   Hunger Vital Sign    Worried About Running Out of Food in the Last Year: Never true    Ran Out of Food in the Last Year: Never true  Transportation Needs: No Transportation Needs (03/18/2023)   PRAPARE - Administrator, Civil Service (Medical): No    Lack of Transportation (Non-Medical): No  Physical Activity: Not on file  Stress: Not on file  Social Connections: Unknown (11/26/2021)   Received from [facility]   Social Network    Social Network: Not on file  Intimate Partner Violence: Not At Risk (03/18/2023)   Humiliation, Afraid, Rape, and Kick questionnaire    Fear of Current or Ex-Partner: No    Emotionally Abused: No    Physically Abused: No    Sexually Abused: No  Depression (PHQ2-9): High Risk (06/24/2024)   Depression (PHQ2-9)    PHQ-2 Score: 16  Alcohol Screen: Not on file  Housing: Low Risk (03/18/2023)   Housing    Last Housing Risk Score: 0  Utilities: Not At Risk (03/18/2023)   AHC Utilities    Threatened with loss of utilities: No  Health Literacy: Not on file    Review of Systems  Constitutional:  Positive for  fatigue.  Psychiatric/Behavioral:  Positive for sleep disturbance.     Vitals:   07/25/24 1250  BP: 128/72  Pulse: 76  Temp: 97.9 F (36.6 C)  SpO2: 99%     Physical Exam Constitutional:      Appearance: Normal appearance.  HENT:     Head: Normocephalic.     Mouth/Throat:     Mouth: Mucous membranes are moist.  Eyes:     General: No scleral icterus. Cardiovascular:     Rate and Rhythm: Normal rate and regular rhythm.     Heart sounds: No murmur heard.  No friction rub.  Pulmonary:     Effort: No respiratory distress.     Breath sounds: No stridor. No wheezing or rhonchi.  Musculoskeletal:     Cervical back: No rigidity or tenderness.  Neurological:     General: No focal deficit present.     Mental Status: She is alert.  Psychiatric:        Mood and Affect: Mood normal.    Epworth Sleepiness Scale of 12 Data Reviewed: No previous sleep study on record   Assessment and Plan Assessment & Plan Obstructive sleep apnea (suspected) Suspected obstructive sleep apnea with symptoms of loud snoring, witnessed apneas, and daytime fatigue. Symptoms began post-pregnancy, with weight gain from 170 lbs to 177 lbs. Family history of snoring. Differential includes anxiety contributing to daytime breathing difficulties. Discussed pathophysiology, including airway collapse during sleep and potential complications such as cardiovascular and cognitive effects. Explained home sleep study to assess severity and treatment options based on results. Discussed CPAP, BiPAP, and alternative treatments like oral devices and Inspire device. Emphasized weight loss as a potential management strategy, especially if symptoms are mild. - Ordered home sleep study to assess severity of sleep apnea. - If home sleep study is significant, will recommend CPAP therapy. - If CPAP is not tolerated, will consider BiPAP or alternative treatments such as oral devices or Inspire device. - Encouraged weight loss  and regular exercise as part of management strategy.  Excessive daytime sleepiness likely related to untreated sleep disordered breathing - Workup will include home sleep study  History of postpartum depression - On Lexapro  - Started about a month ago on, has not seen any significant effect on her sleep  Orders Placed This Encounter  Procedures   Home sleep test    Standing Status:   Future    Expiration Date:   07/25/2025    Where should this test be performed::   [facility]    Follow-up will be scheduled in about 3 months  Jennet Epley MD [facility] 07/25/2024, 1:15 PM  CC: Mercer Clotilda SAUNDERS, MD   "

## 2024-07-29 ENCOUNTER — Ambulatory Visit: Admitting: Family Medicine

## 2024-08-08 ENCOUNTER — Ambulatory Visit: Admitting: Family Medicine

## 2024-08-12 ENCOUNTER — Ambulatory Visit: Admitting: Family Medicine

## 2024-08-17 ENCOUNTER — Ambulatory Visit: Admitting: Pulmonary Disease
